# Patient Record
Sex: Male | Born: 1960 | Race: White | Hispanic: No | Marital: Single | Smoking: Former smoker
Health system: Southern US, Community
[De-identification: ages and names within clinical notes are randomized; demographics above are authoritative.]

## PROBLEM LIST (undated history)

## (undated) DIAGNOSIS — I10 Essential (primary) hypertension: Secondary | ICD-10-CM

## (undated) DIAGNOSIS — G2582 Stiff-man syndrome: Secondary | ICD-10-CM

## (undated) DIAGNOSIS — F410 Panic disorder [episodic paroxysmal anxiety] without agoraphobia: Secondary | ICD-10-CM

## (undated) DIAGNOSIS — S7290XA Unspecified fracture of unspecified femur, initial encounter for closed fracture: Secondary | ICD-10-CM

## (undated) DIAGNOSIS — K449 Diaphragmatic hernia without obstruction or gangrene: Secondary | ICD-10-CM

## (undated) DIAGNOSIS — F988 Other specified behavioral and emotional disorders with onset usually occurring in childhood and adolescence: Secondary | ICD-10-CM

## (undated) DIAGNOSIS — F411 Generalized anxiety disorder: Secondary | ICD-10-CM

## (undated) DIAGNOSIS — M545 Low back pain: Secondary | ICD-10-CM

## (undated) DIAGNOSIS — K219 Gastro-esophageal reflux disease without esophagitis: Secondary | ICD-10-CM

## (undated) DIAGNOSIS — S42009A Fracture of unspecified part of unspecified clavicle, initial encounter for closed fracture: Secondary | ICD-10-CM

## (undated) DIAGNOSIS — G8929 Other chronic pain: Secondary | ICD-10-CM

## (undated) HISTORY — PX: TONSILLECTOMY: SUR1361

## (undated) HISTORY — DX: Essential (primary) hypertension: I10

## (undated) HISTORY — PX: SPINE SURGERY: SHX786

## (undated) HISTORY — DX: Generalized anxiety disorder: F41.1

## (undated) HISTORY — DX: Gastro-esophageal reflux disease without esophagitis: K21.9

## (undated) HISTORY — DX: Fracture of unspecified part of unspecified clavicle, initial encounter for closed fracture: S42.009A

## (undated) HISTORY — PX: OTHER SURGICAL HISTORY: SHX169

## (undated) HISTORY — DX: Diaphragmatic hernia without obstruction or gangrene: K44.9

## (undated) HISTORY — DX: Other chronic pain: G89.29

## (undated) HISTORY — DX: Stiff-man syndrome: G25.82

## (undated) HISTORY — DX: Other specified behavioral and emotional disorders with onset usually occurring in childhood and adolescence: F98.8

## (undated) HISTORY — PX: LUNG LOBECTOMY: SHX167

## (undated) HISTORY — DX: Unspecified fracture of unspecified femur, initial encounter for closed fracture: S72.90XA

## (undated) HISTORY — DX: Panic disorder (episodic paroxysmal anxiety): F41.0

## (undated) HISTORY — DX: Low back pain: M54.5

## (undated) HISTORY — PX: OPEN ANTERIOR SHOULDER RECONSTRUCTION: SHX2100

---

## 2000-05-01 ENCOUNTER — Encounter: Payer: Self-pay | Admitting: General Surgery

## 2000-05-01 ENCOUNTER — Inpatient Hospital Stay (HOSPITAL_COMMUNITY): Admission: EM | Admit: 2000-05-01 | Discharge: 2000-05-03 | Payer: Self-pay

## 2000-05-01 ENCOUNTER — Encounter: Payer: Self-pay | Admitting: Emergency Medicine

## 2000-05-08 ENCOUNTER — Ambulatory Visit (HOSPITAL_COMMUNITY): Admission: RE | Admit: 2000-05-08 | Discharge: 2000-05-08 | Payer: Self-pay | Admitting: General Surgery

## 2000-05-08 ENCOUNTER — Encounter: Payer: Self-pay | Admitting: General Surgery

## 2000-05-08 ENCOUNTER — Ambulatory Visit (HOSPITAL_COMMUNITY): Admission: RE | Admit: 2000-05-08 | Discharge: 2000-05-08 | Payer: Self-pay

## 2000-05-14 ENCOUNTER — Ambulatory Visit (HOSPITAL_COMMUNITY): Admission: RE | Admit: 2000-05-14 | Discharge: 2000-05-14 | Payer: Self-pay | Admitting: Orthopedic Surgery

## 2000-05-14 ENCOUNTER — Encounter: Payer: Self-pay | Admitting: Orthopedic Surgery

## 2000-05-19 ENCOUNTER — Emergency Department (HOSPITAL_COMMUNITY): Admission: EM | Admit: 2000-05-19 | Discharge: 2000-05-19 | Payer: Self-pay | Admitting: Emergency Medicine

## 2000-05-29 ENCOUNTER — Ambulatory Visit (HOSPITAL_BASED_OUTPATIENT_CLINIC_OR_DEPARTMENT_OTHER): Admission: RE | Admit: 2000-05-29 | Discharge: 2000-05-29 | Payer: Self-pay | Admitting: Orthopedic Surgery

## 2000-09-28 ENCOUNTER — Encounter: Payer: Self-pay | Admitting: Emergency Medicine

## 2000-09-28 ENCOUNTER — Emergency Department (HOSPITAL_COMMUNITY): Admission: EM | Admit: 2000-09-28 | Discharge: 2000-09-28 | Payer: Self-pay | Admitting: Emergency Medicine

## 2000-10-08 ENCOUNTER — Emergency Department (HOSPITAL_COMMUNITY): Admission: EM | Admit: 2000-10-08 | Discharge: 2000-10-08 | Payer: Self-pay | Admitting: Emergency Medicine

## 2000-10-08 ENCOUNTER — Encounter: Payer: Self-pay | Admitting: Emergency Medicine

## 2000-10-18 ENCOUNTER — Emergency Department (HOSPITAL_COMMUNITY): Admission: EM | Admit: 2000-10-18 | Discharge: 2000-10-18 | Payer: Self-pay | Admitting: Emergency Medicine

## 2001-04-01 ENCOUNTER — Ambulatory Visit (HOSPITAL_BASED_OUTPATIENT_CLINIC_OR_DEPARTMENT_OTHER): Admission: RE | Admit: 2001-04-01 | Discharge: 2001-04-01 | Payer: Self-pay | Admitting: *Deleted

## 2002-04-13 ENCOUNTER — Emergency Department (HOSPITAL_COMMUNITY): Admission: EM | Admit: 2002-04-13 | Discharge: 2002-04-13 | Payer: Self-pay | Admitting: *Deleted

## 2002-04-15 ENCOUNTER — Emergency Department (HOSPITAL_COMMUNITY): Admission: EM | Admit: 2002-04-15 | Discharge: 2002-04-15 | Payer: Self-pay | Admitting: Emergency Medicine

## 2002-04-15 ENCOUNTER — Encounter: Payer: Self-pay | Admitting: Emergency Medicine

## 2002-04-22 ENCOUNTER — Emergency Department (HOSPITAL_COMMUNITY): Admission: EM | Admit: 2002-04-22 | Discharge: 2002-04-22 | Payer: Self-pay | Admitting: Emergency Medicine

## 2002-04-28 ENCOUNTER — Emergency Department (HOSPITAL_COMMUNITY): Admission: EM | Admit: 2002-04-28 | Discharge: 2002-04-28 | Payer: Self-pay | Admitting: Emergency Medicine

## 2002-05-04 ENCOUNTER — Encounter: Admission: RE | Admit: 2002-05-04 | Discharge: 2002-05-04 | Payer: Self-pay | Admitting: Internal Medicine

## 2002-05-04 ENCOUNTER — Encounter: Payer: Self-pay | Admitting: Internal Medicine

## 2002-06-05 ENCOUNTER — Encounter: Payer: Self-pay | Admitting: Neurosurgery

## 2002-06-06 ENCOUNTER — Emergency Department (HOSPITAL_COMMUNITY): Admission: EM | Admit: 2002-06-06 | Discharge: 2002-06-06 | Payer: Self-pay

## 2002-06-09 ENCOUNTER — Encounter: Payer: Self-pay | Admitting: Neurosurgery

## 2002-06-09 ENCOUNTER — Ambulatory Visit (HOSPITAL_COMMUNITY): Admission: RE | Admit: 2002-06-09 | Discharge: 2002-06-09 | Payer: Self-pay | Admitting: Neurosurgery

## 2002-07-08 ENCOUNTER — Ambulatory Visit (HOSPITAL_COMMUNITY): Admission: RE | Admit: 2002-07-08 | Discharge: 2002-07-08 | Payer: Self-pay | Admitting: Neurosurgery

## 2002-07-08 ENCOUNTER — Encounter: Payer: Self-pay | Admitting: Neurosurgery

## 2002-07-17 ENCOUNTER — Encounter: Admission: RE | Admit: 2002-07-17 | Discharge: 2002-08-28 | Payer: Self-pay | Admitting: Neurosurgery

## 2002-10-06 ENCOUNTER — Emergency Department (HOSPITAL_COMMUNITY): Admission: EM | Admit: 2002-10-06 | Discharge: 2002-10-06 | Payer: Self-pay | Admitting: Emergency Medicine

## 2004-06-30 ENCOUNTER — Emergency Department (HOSPITAL_COMMUNITY): Admission: EM | Admit: 2004-06-30 | Discharge: 2004-06-30 | Payer: Self-pay | Admitting: Emergency Medicine

## 2004-07-01 ENCOUNTER — Ambulatory Visit (HOSPITAL_COMMUNITY): Admission: RE | Admit: 2004-07-01 | Discharge: 2004-07-01 | Payer: Self-pay | Admitting: Sports Medicine

## 2004-07-16 ENCOUNTER — Emergency Department (HOSPITAL_COMMUNITY): Admission: EM | Admit: 2004-07-16 | Discharge: 2004-07-17 | Payer: Self-pay | Admitting: Emergency Medicine

## 2004-10-13 ENCOUNTER — Ambulatory Visit: Payer: Self-pay | Admitting: Internal Medicine

## 2004-12-06 ENCOUNTER — Encounter
Admission: RE | Admit: 2004-12-06 | Discharge: 2005-03-06 | Payer: Self-pay | Admitting: Physical Medicine & Rehabilitation

## 2005-05-23 ENCOUNTER — Emergency Department (HOSPITAL_COMMUNITY): Admission: EM | Admit: 2005-05-23 | Discharge: 2005-05-24 | Payer: Self-pay | Admitting: Emergency Medicine

## 2005-06-01 ENCOUNTER — Ambulatory Visit: Payer: Self-pay | Admitting: Internal Medicine

## 2006-02-13 ENCOUNTER — Observation Stay (HOSPITAL_COMMUNITY): Admission: AD | Admit: 2006-02-13 | Discharge: 2006-02-14 | Payer: Self-pay | Admitting: Internal Medicine

## 2006-02-13 ENCOUNTER — Ambulatory Visit: Payer: Self-pay | Admitting: Internal Medicine

## 2006-02-14 ENCOUNTER — Ambulatory Visit: Payer: Self-pay | Admitting: Internal Medicine

## 2006-02-20 ENCOUNTER — Ambulatory Visit: Payer: Self-pay | Admitting: Internal Medicine

## 2006-10-16 ENCOUNTER — Ambulatory Visit: Payer: Self-pay | Admitting: Internal Medicine

## 2007-01-28 DIAGNOSIS — K219 Gastro-esophageal reflux disease without esophagitis: Secondary | ICD-10-CM

## 2007-01-28 DIAGNOSIS — S42009A Fracture of unspecified part of unspecified clavicle, initial encounter for closed fracture: Secondary | ICD-10-CM

## 2007-01-28 DIAGNOSIS — M545 Low back pain, unspecified: Secondary | ICD-10-CM

## 2007-01-28 DIAGNOSIS — F411 Generalized anxiety disorder: Secondary | ICD-10-CM

## 2007-01-28 DIAGNOSIS — S7290XA Unspecified fracture of unspecified femur, initial encounter for closed fracture: Secondary | ICD-10-CM

## 2007-01-28 DIAGNOSIS — R32 Unspecified urinary incontinence: Secondary | ICD-10-CM

## 2007-01-28 DIAGNOSIS — K449 Diaphragmatic hernia without obstruction or gangrene: Secondary | ICD-10-CM

## 2007-01-28 DIAGNOSIS — F410 Panic disorder [episodic paroxysmal anxiety] without agoraphobia: Secondary | ICD-10-CM

## 2007-01-28 DIAGNOSIS — F988 Other specified behavioral and emotional disorders with onset usually occurring in childhood and adolescence: Secondary | ICD-10-CM

## 2007-01-28 HISTORY — DX: Fracture of unspecified part of unspecified clavicle, initial encounter for closed fracture: S42.009A

## 2007-01-28 HISTORY — DX: Diaphragmatic hernia without obstruction or gangrene: K44.9

## 2007-01-28 HISTORY — DX: Generalized anxiety disorder: F41.1

## 2007-01-28 HISTORY — DX: Panic disorder (episodic paroxysmal anxiety): F41.0

## 2007-01-28 HISTORY — DX: Gastro-esophageal reflux disease without esophagitis: K21.9

## 2007-01-28 HISTORY — DX: Other specified behavioral and emotional disorders with onset usually occurring in childhood and adolescence: F98.8

## 2007-01-28 HISTORY — DX: Low back pain, unspecified: M54.50

## 2007-01-28 HISTORY — DX: Unspecified fracture of unspecified femur, initial encounter for closed fracture: S72.90XA

## 2007-08-14 ENCOUNTER — Telehealth: Payer: Self-pay | Admitting: Internal Medicine

## 2007-12-13 ENCOUNTER — Ambulatory Visit: Payer: Self-pay | Admitting: Internal Medicine

## 2007-12-13 DIAGNOSIS — I10 Essential (primary) hypertension: Secondary | ICD-10-CM

## 2007-12-13 HISTORY — DX: Essential (primary) hypertension: I10

## 2008-01-10 ENCOUNTER — Telehealth: Payer: Self-pay | Admitting: Internal Medicine

## 2008-02-10 ENCOUNTER — Telehealth (INDEPENDENT_AMBULATORY_CARE_PROVIDER_SITE_OTHER): Payer: Self-pay | Admitting: *Deleted

## 2008-02-17 ENCOUNTER — Ambulatory Visit: Payer: Self-pay | Admitting: Internal Medicine

## 2008-03-10 ENCOUNTER — Encounter (INDEPENDENT_AMBULATORY_CARE_PROVIDER_SITE_OTHER): Payer: Self-pay | Admitting: *Deleted

## 2008-03-13 ENCOUNTER — Telehealth (INDEPENDENT_AMBULATORY_CARE_PROVIDER_SITE_OTHER): Payer: Self-pay | Admitting: *Deleted

## 2008-04-17 ENCOUNTER — Encounter
Admission: RE | Admit: 2008-04-17 | Discharge: 2008-04-17 | Payer: Self-pay | Admitting: Physical Medicine & Rehabilitation

## 2008-04-17 ENCOUNTER — Telehealth (INDEPENDENT_AMBULATORY_CARE_PROVIDER_SITE_OTHER): Payer: Self-pay | Admitting: *Deleted

## 2008-05-19 ENCOUNTER — Ambulatory Visit: Payer: Self-pay | Admitting: Internal Medicine

## 2008-05-19 DIAGNOSIS — J069 Acute upper respiratory infection, unspecified: Secondary | ICD-10-CM | POA: Insufficient documentation

## 2008-06-15 ENCOUNTER — Telehealth (INDEPENDENT_AMBULATORY_CARE_PROVIDER_SITE_OTHER): Payer: Self-pay | Admitting: *Deleted

## 2008-08-17 ENCOUNTER — Ambulatory Visit: Payer: Self-pay | Admitting: Internal Medicine

## 2008-09-03 ENCOUNTER — Encounter: Payer: Self-pay | Admitting: Internal Medicine

## 2008-09-08 ENCOUNTER — Telehealth (INDEPENDENT_AMBULATORY_CARE_PROVIDER_SITE_OTHER): Payer: Self-pay | Admitting: *Deleted

## 2008-11-10 ENCOUNTER — Ambulatory Visit: Payer: Self-pay | Admitting: Internal Medicine

## 2009-01-15 ENCOUNTER — Telehealth (INDEPENDENT_AMBULATORY_CARE_PROVIDER_SITE_OTHER): Payer: Self-pay | Admitting: *Deleted

## 2009-02-08 ENCOUNTER — Telehealth: Payer: Self-pay | Admitting: Internal Medicine

## 2009-05-17 ENCOUNTER — Ambulatory Visit: Payer: Self-pay | Admitting: Internal Medicine

## 2009-05-17 DIAGNOSIS — M25519 Pain in unspecified shoulder: Secondary | ICD-10-CM

## 2009-08-09 ENCOUNTER — Telehealth: Payer: Self-pay | Admitting: Internal Medicine

## 2009-11-11 ENCOUNTER — Ambulatory Visit: Payer: Self-pay | Admitting: Internal Medicine

## 2010-02-07 ENCOUNTER — Telehealth: Payer: Self-pay | Admitting: Internal Medicine

## 2010-05-10 ENCOUNTER — Ambulatory Visit: Payer: Self-pay | Admitting: Internal Medicine

## 2010-05-10 DIAGNOSIS — R5383 Other fatigue: Secondary | ICD-10-CM

## 2010-05-10 DIAGNOSIS — R5381 Other malaise: Secondary | ICD-10-CM | POA: Insufficient documentation
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
## 2010-07-26 NOTE — Progress Notes (Signed)
Summary: Rx refill  Phone Note Call from Patient Call back at Home Phone [phone]   Caller: Patient Summary of Call: Pt called requesting refills of Adderall, Diazepam amd Morphine sulfate. Initial call taken by: Margaret Pyle, CMA,  February 07, 2010 10:12 AM  Follow-up for Phone Call        all done hardcopy to LIM side B - dahlia  Follow-up by: Corwin Levins MD,  February 07, 2010 1:49 PM  Additional Follow-up for Phone Call Additional follow up Details #1::        Pt informed, Rx in cabinet for pt pick up Additional Follow-up by: Margaret Pyle, CMA,  February 07, 2010 1:56 PM    New/Updated Medications: DIAZEPAM 5 MG TABS (DIAZEPAM) 1po three times a day as needed ADDERALL 20 MG  TABS (AMPHETAMINE-DEXTROAMPHETAMINE) 1 by mouth  two times a day - to fill sept 17, 2011 ADDERALL 20 MG  TABS (AMPHETAMINE-DEXTROAMPHETAMINE) 1 by mouth  two times a day - to fill Apr 11, 2010 ADDERALL 20 MG  TABS (AMPHETAMINE-DEXTROAMPHETAMINE) 1 by mouth  two times a day - to fill Feb 10, 2010 MS CONTIN 30 MG XR12H-TAB (MORPHINE SULFATE) 1 by mouth two times a day - to fill Feb 10, 2010 MS CONTIN 30 MG XR12H-TAB (MORPHINE SULFATE) 1 by mouth two times a day - to fill sept 17, 2011 MS CONTIN 30 MG XR12H-TAB (MORPHINE SULFATE) 1 by mouth two times a day - to fill Apr 11, 2010 Prescriptions: MS CONTIN 30 MG XR12H-TAB (MORPHINE SULFATE) 1 by mouth two times a day - to fill Apr 11, 2010  #60 x 0   Entered and Authorized by:   Corwin Levins MD   Signed by:   Corwin Levins MD on 02/07/2010   Method used:   Print then Give to Patient   RxID:   [id number] MS CONTIN 30 MG XR12H-TAB (MORPHINE SULFATE) 1 by mouth two times a day - to fill sept 17, 2011  #60 x 0   Entered and Authorized by:   Corwin Levins MD   Signed by:   Corwin Levins MD on 02/07/2010   Method used:   Print then Give to Patient   RxID:   [id number] MS CONTIN 30 MG XR12H-TAB (MORPHINE SULFATE) 1 by mouth two times a  day - to fill Feb 10, 2010  #60 x 0   Entered and Authorized by:   Corwin Levins MD   Signed by:   Corwin Levins MD on 02/07/2010   Method used:   Print then Give to Patient   RxID:   [id number] DIAZEPAM 5 MG TABS (DIAZEPAM) 1po three times a day as needed  #90 x 2   Entered and Authorized by:   Corwin Levins MD   Signed by:   Corwin Levins MD on 02/07/2010   Method used:   Print then Give to Patient   RxID:   [id number] ADDERALL 20 MG  TABS (AMPHETAMINE-DEXTROAMPHETAMINE) 1 by mouth  two times a day - to fill Apr 11, 2010  #60 x 0   Entered and Authorized by:   Corwin Levins MD   Signed by:   Corwin Levins MD on 02/07/2010   Method used:   Print then Give to Patient   RxID:   [id number] ADDERALL 20 MG  TABS (AMPHETAMINE-DEXTROAMPHETAMINE) 1 by mouth  two times a day - to fill sept 17, 2011  #60 x 0  Entered and Authorized by:   Corwin Levins MD   Signed by:   Corwin Levins MD on 02/07/2010   Method used:   Print then Give to Patient   RxID:   [id number] ADDERALL 20 MG  TABS (AMPHETAMINE-DEXTROAMPHETAMINE) 1 by mouth  two times a day - to fill Feb 10, 2010  #60 x 0   Entered and Authorized by:   Corwin Levins MD   Signed by:   Corwin Levins MD on 02/07/2010   Method used:   Print then Give to Patient   RxID:   [id number]

## 2010-07-26 NOTE — Progress Notes (Signed)
Summary: refills  Phone Note Call from Patient Call back at Home Phone [phone]   Caller: Patient Summary of Call: pt called requesting 3 month refills of Morphine and Adderall. Pt will be in [facility] Thursday afternoon. Initial call taken by: Margaret Pyle, CMA,  August 09, 2009 1:51 PM  Follow-up for Phone Call        all done hardcopy to LIM side B - dahlia  Follow-up by: Corwin Levins MD,  August 09, 2009 4:53 PM  Additional Follow-up for Phone Call Additional follow up Details #1::        pt informed, rx in cabinet for pt pick up Additional Follow-up by: Margaret Pyle, CMA,  August 10, 2009 8:17 AM    New/Updated Medications: ADDERALL 20 MG  TABS (AMPHETAMINE-DEXTROAMPHETAMINE) 1 by mouth  two times a day - to fill Sep 13, 2009 ADDERALL 20 MG  TABS (AMPHETAMINE-DEXTROAMPHETAMINE) 1 by mouth  two times a day - to fill Oct 13, 2009 ADDERALL 20 MG  TABS (AMPHETAMINE-DEXTROAMPHETAMINE) 1 by mouth  two times a day - to fill Aug 14, 2009 MS CONTIN 30 MG XR12H-TAB (MORPHINE SULFATE) 1 by mouth two times a day - to fill Oct 13, 2009 MS CONTIN 30 MG XR12H-TAB (MORPHINE SULFATE) 1 by mouth two times a day - to fill Aug 14, 2009 MS CONTIN 30 MG XR12H-TAB (MORPHINE SULFATE) 1 by mouth two times a day - to fill Sep 13, 2009 Prescriptions: ADDERALL 20 MG  TABS (AMPHETAMINE-DEXTROAMPHETAMINE) 1 by mouth  two times a day - to fill Oct 13, 2009  #60 x 0   Entered and Authorized by:   Corwin Levins MD   Signed by:   Corwin Levins MD on 08/09/2009   Method used:   Print then Give to Patient   RxID:   [id number] ADDERALL 20 MG  TABS (AMPHETAMINE-DEXTROAMPHETAMINE) 1 by mouth  two times a day - to fill Sep 13, 2009  #60 x 0   Entered and Authorized by:   Corwin Levins MD   Signed by:   Corwin Levins MD on 08/09/2009   Method used:   Print then Give to Patient   RxID:   [id number] ADDERALL 20 MG  TABS (AMPHETAMINE-DEXTROAMPHETAMINE) 1 by mouth  two times a  day - to fill Aug 14, 2009  #60 x 0   Entered and Authorized by:   Corwin Levins MD   Signed by:   Corwin Levins MD on 08/09/2009   Method used:   Print then Give to Patient   RxID:   [id number] MS CONTIN 30 MG XR12H-TAB (MORPHINE SULFATE) 1 by mouth two times a day - to fill Oct 13, 2009  #60 x 0   Entered and Authorized by:   Corwin Levins MD   Signed by:   Corwin Levins MD on 08/09/2009   Method used:   Print then Give to Patient   RxID:   [id number] MS CONTIN 30 MG XR12H-TAB (MORPHINE SULFATE) 1 by mouth two times a day - to fill Sep 13, 2009  #60 x 0   Entered and Authorized by:   Corwin Levins MD   Signed by:   Corwin Levins MD on 08/09/2009   Method used:   Print then Give to Patient   RxID:   [id number] MS CONTIN 30 MG XR12H-TAB (MORPHINE SULFATE) 1 by mouth two times a day - to fill Aug 14, 2009  #60 x 0  Entered and Authorized by:   Corwin Levins MD   Signed by:   Corwin Levins MD on 08/09/2009   Method used:   Print then Give to Patient   RxID:   [id number]
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
## 2010-08-04 ENCOUNTER — Telehealth: Payer: Self-pay | Admitting: Internal Medicine

## 2010-08-11 NOTE — Progress Notes (Signed)
Summary: Rx refill req  Phone Note Refill Request Message from:  Patient on August 04, 2010 10:27 AM  Refills Requested: Medication #1:  MS CONTIN 30 MG XR12H-TAB 1 by mouth two times a day - to fill jan 15   Dosage confirmed as above?Dosage Confirmed   Supply Requested: 1 month  Method Requested: Pick up at Office Initial call taken by: Margaret Pyle, CMA,  August 04, 2010 10:27 AM  Follow-up for Phone Call        done hardcopy to LIM side B - dahlia  Follow-up by: Corwin Levins MD,  August 04, 2010 11:01 AM  Additional Follow-up for Phone Call Additional follow up Details #1::        Notified pt rx's ready for pick-up. Pt also want to know can he get refill on his adderal. Pls advise? Additional Follow-up by: Orlan Leavens RMA,  August 04, 2010 11:33 AM    Additional Follow-up for Phone Call Additional follow up Details #2::    adderall - done hardcopy to LIM side B - dahlia  Follow-up by: Corwin Levins MD,  August 04, 2010 11:46 AM  Additional Follow-up for Phone Call Additional follow up Details #3:: Details for Additional Follow-up Action Taken: MD sign adderal rx's will put in envelope with MS Rx's. Pt was notified Additional Follow-up by: Orlan Leavens RMA,  August 04, 2010 11:52 AM  New/Updated Medications: ADDERALL 20 MG  TABS (AMPHETAMINE-DEXTROAMPHETAMINE) 1 by mouth  two times a day - to fill Sep 04, 2010 ADDERALL 20 MG  TABS (AMPHETAMINE-DEXTROAMPHETAMINE) 1 by mouth  two times a day - to fill Oct 04, 2010 ADDERALL 20 MG  TABS (AMPHETAMINE-DEXTROAMPHETAMINE) 1 by mouth  two times a day - to fill Aug 04, 2010 MS CONTIN 30 MG XR12H-TAB (MORPHINE SULFATE) 1 by mouth two times a day - to fill Oct 09, 2010 - please make return visit for further refills MS CONTIN 30 MG XR12H-TAB (MORPHINE SULFATE) 1 by mouth two times a day - to fill Aug 09, 2010 MS CONTIN 30 MG XR12H-TAB (MORPHINE SULFATE) 1 by mouth two times a day - to fill Sep 09, 2010 Prescriptions: ADDERALL 20 MG  TABS (AMPHETAMINE-DEXTROAMPHETAMINE) 1 by mouth  two times a day - to fill Oct 04, 2010  #60 x 0   Entered and Authorized by:   Corwin Levins MD   Signed by:   Corwin Levins MD on 08/04/2010   Method used:   Print then Give to Patient   RxID:   [id number] ADDERALL 20 MG  TABS (AMPHETAMINE-DEXTROAMPHETAMINE) 1 by mouth  two times a day - to fill Sep 04, 2010  #60 x 0   Entered and Authorized by:   Corwin Levins MD   Signed by:   Corwin Levins MD on 08/04/2010   Method used:   Print then Give to Patient   RxID:   [id number] ADDERALL 20 MG  TABS (AMPHETAMINE-DEXTROAMPHETAMINE) 1 by mouth  two times a day - to fill Aug 04, 2010  #60 x 0   Entered and Authorized by:   Corwin Levins MD   Signed by:   Corwin Levins MD on 08/04/2010   Method used:   Print then Give to Patient   RxID:   [id number] MS CONTIN 30 MG XR12H-TAB (MORPHINE SULFATE) 1 by mouth two times a day - to fill Oct 09, 2010 - please make return visit for  further refills  #60 x 0   Entered and Authorized by:   Corwin Levins MD   Signed by:   Corwin Levins MD on 08/04/2010   Method used:   Print then Give to Patient   RxID:   [id number] MS CONTIN 30 MG XR12H-TAB (MORPHINE SULFATE) 1 by mouth two times a day - to fill Sep 09, 2010  #60 x 0   Entered and Authorized by:   Corwin Levins MD   Signed by:   Corwin Levins MD on 08/04/2010   Method used:   Print then Give to Patient   RxID:   [id number] MS CONTIN 30 MG XR12H-TAB (MORPHINE SULFATE) 1 by mouth two times a day - to fill Aug 09, 2010  #60 x 0   Entered and Authorized by:   Corwin Levins MD   Signed by:   Corwin Levins MD on 08/04/2010   Method used:   Print then Give to Patient   RxID:   [id number]

## 2010-11-06 ENCOUNTER — Encounter: Payer: Self-pay | Admitting: Internal Medicine

## 2010-11-06 DIAGNOSIS — Z0000 Encounter for general adult medical examination without abnormal findings: Secondary | ICD-10-CM | POA: Insufficient documentation

## 2010-11-07 ENCOUNTER — Encounter: Payer: Self-pay | Admitting: Internal Medicine

## 2010-11-08 ENCOUNTER — Ambulatory Visit (INDEPENDENT_AMBULATORY_CARE_PROVIDER_SITE_OTHER): Payer: Self-pay | Admitting: Internal Medicine

## 2010-11-08 ENCOUNTER — Encounter: Payer: Self-pay | Admitting: Internal Medicine

## 2010-11-08 VITALS — BP 104/64 | HR 105 | Temp 98.6°F | Ht 72.0 in | Wt 199.2 lb

## 2010-11-08 DIAGNOSIS — M545 Low back pain, unspecified: Secondary | ICD-10-CM

## 2010-11-08 DIAGNOSIS — G8929 Other chronic pain: Secondary | ICD-10-CM

## 2010-11-08 DIAGNOSIS — F411 Generalized anxiety disorder: Secondary | ICD-10-CM

## 2010-11-08 DIAGNOSIS — F988 Other specified behavioral and emotional disorders with onset usually occurring in childhood and adolescence: Secondary | ICD-10-CM

## 2010-11-08 DIAGNOSIS — I10 Essential (primary) hypertension: Secondary | ICD-10-CM

## 2010-11-08 DIAGNOSIS — N320 Bladder-neck obstruction: Secondary | ICD-10-CM

## 2010-11-08 DIAGNOSIS — G2582 Stiff-man syndrome: Secondary | ICD-10-CM

## 2010-11-08 HISTORY — DX: Stiff-man syndrome: G25.82

## 2010-11-08 HISTORY — DX: Other chronic pain: G89.29

## 2010-11-08 HISTORY — DX: Low back pain, unspecified: M54.50

## 2010-11-08 MED ORDER — AMPHETAMINE-DEXTROAMPHETAMINE 20 MG PO TABS: 20.0000 mg | ORAL_TABLET | Freq: Two times a day (BID) | ORAL | Status: DC

## 2010-11-08 MED ORDER — MORPHINE SULFATE CR 30 MG PO TB12: 30.0000 mg | ORAL_TABLET | Freq: Two times a day (BID) | ORAL | Status: DC

## 2010-11-08 MED ORDER — DIAZEPAM 5 MG PO TABS: 5.0000 mg | ORAL_TABLET | Freq: Three times a day (TID) | ORAL | Status: DC | PRN

## 2010-11-08 MED ORDER — BENAZEPRIL HCL 5 MG PO TABS: 5.0000 mg | ORAL_TABLET | Freq: Every day | ORAL | Status: DC
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
## 2010-11-08 NOTE — Assessment & Plan Note (Signed)
stable overall by hx and exam, and pt to continue medical treatment as before 

## 2010-11-08 NOTE — Patient Instructions (Signed)
Continue all other medications as before Please go to LAB in the Basement for the blood and/or urine tests to be done today Please call the phone number [phone] (the PhoneTree System) for results of testing in 2-3 days;  When calling, simply dial the number, and when prompted enter the MRN number above (the Medical Record Number) and the # key, then the message should start. Please return in 6 months 

## 2010-11-08 NOTE — Assessment & Plan Note (Signed)
stable overall by hx and exam, most recent lab reviewed with pt, and pt to continue medical treatment as before  BP Readings from Last 3 Encounters:  11/08/10 104/64  05/10/10 110/70  11/11/09 110/72   For labs today

## 2010-11-08 NOTE — Assessment & Plan Note (Signed)
stable overall by hx and exam, most recent lab reviewed with pt, and pt to continue medical treatment as before 

## 2010-11-08 NOTE — Assessment & Plan Note (Signed)
To check psa today
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
## 2010-11-11 NOTE — Op Note (Signed)
NAME:  Peter Holloway, Peter Holloway                         ACCOUNT NO.:  [id number]   MEDICAL RECORD NO.:  [id number]                   PATIENT TYPE:  EMS   LOCATION:  [facility]                                 FACILITY:  [facility]   PHYSICIAN:  Kathaleen Maser. Pool, M.D.                 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   OPERATION PERFORMED:  Right L2-3 laminotomy with microdiskectomy.   SURGEON:  Kathaleen Maser. Pool, M.D.   ASSISTANT:  Reinaldo Meeker, M.D.   ANESTHESIA:  General endotracheal.   INDICATIONS FOR PROCEDURE:  The patient is a 50 year old male with a history  of right lower extremity pain, paresthesias, weakness consistent with a  right-sided L2 and L3 radiculopathy with has failed conservative management.  It should be noted that on the patient's chart, he was improperly consented  for a left-sided laminotomy and microdiskectomy.  With discussion with the  patient, once again reviewing his films and his examination, it was  determined that in fact, the problem was right-sided and the consent form  was changed to read right L2-3 laminotomy and microdiskectomy.  The patient  agreed to proceed with surgery.   DESCRIPTION OF PROCEDURE:  The patient was taken to the operating room and  placed on the table in the supine position.  After adequate level of  anesthesia was achieved, the patient was positioned prone.  The patient's  lumbar region was prepped and draped sterilely.  A 10 blade was used to make  a linear skin incision overlying the L2-3 interspace.  This was carried down  sharply in the midline.  A subperiosteal dissection was then performed  exposing the lamina and facet joints of L2 and L3 on the right.  Deep  self-  retaining retractor was placed.  Intraoperative x-ray was taken, the level  was confirmed.  The laminotomy was then performed using high speed drill and  Kerrison rongeurs to remove the inferior one half of the lamina at L2, the  medial one quarter of the L2-3 facet joint and the superior rim of the  L3.  The ligamentum flavum was then elevated and resected in piecemeal fashion  using Kerrison rongeurs.  The underlying thecal sac and exiting L3 nerve  root were identified.  The microscope was brought into the field and used  for microdissection of the right-sided L3 nerve root and underlying disk  herniation.  The epidural venous plexus was coagulated and cut.  The thecal  sac and right-sided L3 nerve root were mobilized and retracted toward the  midline.  The epidural venous plexus was coagulated and cut.  Disk  herniation was apparent.  This  was then incised with a 15 blade in  rectangular fashion.  A wide disk space cleanout was then achieved pituitary  rongeurs and upward angled pituitary rongeurs and Epstein curets.  All loose  or obvious degenerative disk material was removed from the interspace.  All  elements of the disk herniation which was superolateral was completely  resected.  At this point a blunt probe was passed and easily opened the  thecal sac and nerve roots.  There was no evidence of injury to thecal sac  or nerve roots.  There was no evidence of residual compression.  The wound  was then irrigated with antibiotic solution.  Gelfoam was placed topically  for hemostasis which was found to be good.  Microscope and retractor system  were removed.  Hemostasis in the muscle was achieved with electrocautery.  The wound was then closed in layers with Vicryl sutures.  Steri-Strips and  sterile  dressing were applied.  There were no apparent complications.  The patient  tolerated the procedure well and returned to the recovery room  postoperatively.                                                 Henry A. Pool, M.D.    HAP/MEDQ  D:  06/09/2002  T:  06/09/2002  Job:  [id number]
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
## 2010-11-11 NOTE — Op Note (Signed)
[place]. [facility]  Patient:    Peter Holloway, Peter Holloway                        MRN: [id number] Proc. Date: 05/01/00 Adm. Date:  [id number] Attending:  Trauma, Md                           Operative Report  PREOPERATIVE DIAGNOSIS:  Right femoral shaft fracture.  POSTOPERATIVE DIAGNOSIS:  Right femoral shaft fracture.  PROCEDURE:  Closed reduction and retrograde intramedullary rodding, right femoral shaft using A/Depuy 10 mm x 25 cm femoral rod.  SURGEON:  Janet Berlin. Dan Humphreys, M.D.  ANESTHESIA:  General.  OPERATIVE TIME: One hour and 15 minutes.  COMPLICATIONS:  None.  DESCRIPTION OF PROCEDURE:  Ms. Quast was brought to the operating room on May 01, 2000, and placed under general anesthesia on the stretcher, and then transferred to the operative table.  His right leg was prepped using sterile Betadine and draped using a sterile technique.  He had good vascularity and pulses preoperatively.  Initially through a 3.0 cm patellar tendon medial incision, initial exposure was made.  The underlying subcutaneous tissues were incised in line with the skin incision.  A median parapatellar incision was made for entrance for a guide pin, into the distal intercondylar region of the femur under fluoroscopic control, and the guide pin was placed and drilled up proximally to the level of the fracture site. It was then overdrilled to a diameter of 12.5 mm with a step drill.  After this was done, then a guide wire was placed from the distal femoral shaft across the fracture site under fluoroscopic control, and into the proximal femoral shaft.  Sequential reaming was then carried out to an 11.5 mm size. Then a 10.0 mm x 25.0 cm femoral rod was placed retrograde from the distal femur, carefully across the fracture site and into the proximal femoral shaft, with the fracture being held in a reduced an anatomical position.  At this point, using the lateral guide, the two most  distal screw holes were drilled and measured and then sequentially a 90 mm and a 75 mm, 6.5 mm screw was placed through a 3.0 cm lateral distal incision.  Two two most proximal screw holes in the femoral rod were then also drilled under fluoroscopic control through another 3.0 cm lateral incision, and then two separate 5.5 mm screws were placed through the interlocking portion of the proximal femoral rod, holding the rod in an excellent position, and holding the fracture in a reduced and anatomical position.  After this was done, it was felt that all pathology had been satisfactorily addressed.  All wound were irrigated.  The arthrotomy was closed with #0 Vicryl suture.  The patellar tendon defect was closed with #0 Vicryl suture.  The iliotibial band and the two incisions laterally were closed with #0 Vicryl.  The subcutaneous tissues were closed with #2-0 Vicryl.  The skin was closed with skin staples.  Sterile dressings were applied, and the patient then was awakened and extubated and taken to the recovery room in stable condition.  Postoperatively the leg lengths were equal.  Rotation was equal.  The neurovascular status was normal.  The pulses were 2+ and symmetric.  The needle and sponge counts were correct x 2 at the end of the case. DD:  05/01/00 TD:  05/02/00 Job: [id number]
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
## 2011-01-20 ENCOUNTER — Other Ambulatory Visit: Payer: Self-pay | Admitting: Internal Medicine

## 2011-01-21 ENCOUNTER — Other Ambulatory Visit: Payer: Self-pay | Admitting: Internal Medicine

## 2011-02-02 ENCOUNTER — Other Ambulatory Visit: Payer: Self-pay

## 2011-02-02 MED ORDER — AMPHETAMINE-DEXTROAMPHETAMINE 20 MG PO TABS: 20.0000 mg | ORAL_TABLET | Freq: Two times a day (BID) | ORAL | Status: DC

## 2011-02-02 MED ORDER — MORPHINE SULFATE CR 30 MG PO TB12: 30.0000 mg | ORAL_TABLET | Freq: Two times a day (BID) | ORAL | Status: DC

## 2011-02-02 NOTE — Telephone Encounter (Signed)
Pt informed, Rx in cabinet for pt pick up  

## 2011-03-01 ENCOUNTER — Other Ambulatory Visit: Payer: Self-pay

## 2011-03-01 MED ORDER — AMPHETAMINE-DEXTROAMPHETAMINE 20 MG PO TABS: 20.0000 mg | ORAL_TABLET | Freq: Two times a day (BID) | ORAL | Status: DC

## 2011-03-01 MED ORDER — MORPHINE SULFATE CR 30 MG PO TB12: 30.0000 mg | ORAL_TABLET | Freq: Two times a day (BID) | ORAL | Status: DC

## 2011-03-01 NOTE — Telephone Encounter (Signed)
Called patient informed prescriptions are ready for pickup.

## 2011-03-01 NOTE — Telephone Encounter (Signed)
Pt called requesting refill, going out of town Friday.

## 2011-03-02 ENCOUNTER — Telehealth: Payer: Self-pay

## 2011-03-02 MED ORDER — AMPHETAMINE-DEXTROAMPHETAMINE 20 MG PO TABS: 20.0000 mg | ORAL_TABLET | Freq: Two times a day (BID) | ORAL | Status: DC

## 2011-03-02 MED ORDER — MORPHINE SULFATE CR 30 MG PO TB12: 30.0000 mg | ORAL_TABLET | Freq: Two times a day (BID) | ORAL | Status: DC

## 2011-03-02 NOTE — Telephone Encounter (Signed)
Pt called stating he has been picking up his narcotic medications 3 months at a time on saturdays for a while, Lately he stays that he has only been getting 1 month Rxs (he has made a specific 3 mth refill request once that I am aware of) and has not been allowed to pick up Rx of Sat. I personally advised pt several times that I would find out who is working sat clinic and if they would be able to give him his Rx. I have also been told that we should not advise pt that Rxs are available for pick any other time but M-F 8-5p. I told pt that I would find out if he can pick up Rx this coming Saturday but he is still not satisfied and requested I advise him of any other practices in his area ([place]). He says he made 4 trips to [facility] yearly, one for OV and the other 3 to pick up 3 months of Rxs. Should pt received 3 months of Rx filled yesterday?

## 2011-03-02 NOTE — Telephone Encounter (Signed)
Pt informed

## 2011-03-02 NOTE — Telephone Encounter (Signed)
I have not been doing 3  Mo rx for most pt 's since onset of epic as I have no way currently of tracking or knowing which pt's I have been doing that for (those who I feel would not abuse or lose the rx)  Ok for 2 more rx at this time - Done hardcopy to [facility]  Please inform pt to make sure he mentions 3 mo rx refills when requesting further  I would defer to Vicie Mutters and established office policy regarding when to pick up the Rx's

## 2011-05-07 ENCOUNTER — Other Ambulatory Visit: Payer: Self-pay | Admitting: Internal Medicine

## 2011-05-08 NOTE — Telephone Encounter (Signed)
Refill not due until nov 21  Ok to refill but will date accordingly

## 2011-05-08 NOTE — Telephone Encounter (Signed)
Faxed hardcopy to pharmacy. 

## 2011-05-15 ENCOUNTER — Ambulatory Visit: Payer: Self-pay | Admitting: Internal Medicine

## 2011-05-15 DIAGNOSIS — Z0289 Encounter for other administrative examinations: Secondary | ICD-10-CM

## 2011-05-26 ENCOUNTER — Encounter: Payer: Self-pay | Admitting: Internal Medicine

## 2011-05-26 ENCOUNTER — Ambulatory Visit (INDEPENDENT_AMBULATORY_CARE_PROVIDER_SITE_OTHER): Payer: Self-pay | Admitting: Internal Medicine

## 2011-05-26 VITALS — BP 110/82 | HR 67 | Temp 97.9°F | Ht 72.0 in | Wt 194.0 lb

## 2011-05-26 DIAGNOSIS — M545 Low back pain: Secondary | ICD-10-CM

## 2011-05-26 DIAGNOSIS — F988 Other specified behavioral and emotional disorders with onset usually occurring in childhood and adolescence: Secondary | ICD-10-CM

## 2011-05-26 DIAGNOSIS — Z23 Encounter for immunization: Secondary | ICD-10-CM

## 2011-05-26 DIAGNOSIS — G8929 Other chronic pain: Secondary | ICD-10-CM

## 2011-05-26 DIAGNOSIS — I10 Essential (primary) hypertension: Secondary | ICD-10-CM

## 2011-05-26 MED ORDER — MORPHINE SULFATE CR 30 MG PO TB12: 30.0000 mg | ORAL_TABLET | Freq: Two times a day (BID) | ORAL | Status: DC

## 2011-05-26 MED ORDER — AMPHETAMINE-DEXTROAMPHETAMINE 20 MG PO TABS: 20.0000 mg | ORAL_TABLET | Freq: Two times a day (BID) | ORAL | Status: DC

## 2011-05-26 NOTE — Patient Instructions (Signed)
You had the flu shot today Continue all other medications as before You are given the refills, please call for further refills Please return in 6 months, or sooner if needed

## 2011-05-27 ENCOUNTER — Encounter: Payer: Self-pay | Admitting: Internal Medicine
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
## 2011-05-27 NOTE — Assessment & Plan Note (Signed)
stable overall by hx and exam, , and pt to continue medical treatment as before, for med refill today

## 2011-05-27 NOTE — Assessment & Plan Note (Signed)
stable overall by hx and exam, most recent data reviewed with pt, and pt to continue medical treatment as before  BP Readings from Last 3 Encounters:  05/26/11 110/82  11/08/10 104/64  05/10/10 110/70

## 2011-05-27 NOTE — Assessment & Plan Note (Signed)
stable overall by hx and exam, and pt to continue medical treatment as before, for med refills today 

## 2011-08-28 ENCOUNTER — Other Ambulatory Visit: Payer: Self-pay

## 2011-08-28 MED ORDER — MORPHINE SULFATE CR 30 MG PO TB12: 30.0000 mg | ORAL_TABLET | Freq: Two times a day (BID) | ORAL | Status: DC

## 2011-08-28 NOTE — Telephone Encounter (Signed)
Patient informed he will pickup prescription at his convenience.

## 2011-08-28 NOTE — Telephone Encounter (Signed)
Pt called requesting 3 mth refill of pain medications.  

## 2011-08-28 NOTE — Telephone Encounter (Signed)
Done hardcopy to robin  

## 2011-11-10 ENCOUNTER — Other Ambulatory Visit: Payer: Self-pay | Admitting: Internal Medicine

## 2011-11-13 ENCOUNTER — Other Ambulatory Visit: Payer: Self-pay | Admitting: Internal Medicine

## 2011-11-24 ENCOUNTER — Ambulatory Visit: Payer: Self-pay | Admitting: Internal Medicine

## 2011-12-01 ENCOUNTER — Encounter: Payer: Self-pay | Admitting: Internal Medicine

## 2011-12-01 ENCOUNTER — Ambulatory Visit (INDEPENDENT_AMBULATORY_CARE_PROVIDER_SITE_OTHER): Payer: Self-pay | Admitting: Internal Medicine

## 2011-12-01 VITALS — BP 118/70 | HR 78 | Temp 98.6°F | Ht 71.5 in | Wt 191.1 lb

## 2011-12-01 DIAGNOSIS — Z7901 Long term (current) use of anticoagulants: Secondary | ICD-10-CM | POA: Insufficient documentation

## 2011-12-01 DIAGNOSIS — F411 Generalized anxiety disorder: Secondary | ICD-10-CM

## 2011-12-01 DIAGNOSIS — M545 Low back pain: Secondary | ICD-10-CM

## 2011-12-01 DIAGNOSIS — G8929 Other chronic pain: Secondary | ICD-10-CM

## 2011-12-01 DIAGNOSIS — F988 Other specified behavioral and emotional disorders with onset usually occurring in childhood and adolescence: Secondary | ICD-10-CM

## 2011-12-01 DIAGNOSIS — I10 Essential (primary) hypertension: Secondary | ICD-10-CM

## 2011-12-01 MED ORDER — BENAZEPRIL HCL 5 MG PO TABS: 5.0000 mg | ORAL_TABLET | Freq: Every day | ORAL | Status: DC

## 2011-12-01 MED ORDER — MORPHINE SULFATE ER 30 MG PO TBCR: 30.0000 mg | EXTENDED_RELEASE_TABLET | Freq: Two times a day (BID) | ORAL | Status: DC

## 2011-12-01 MED ORDER — WARFARIN SODIUM 5 MG PO TABS: 5.0000 mg | ORAL_TABLET | Freq: Every day | ORAL | Status: DC

## 2011-12-01 MED ORDER — DIAZEPAM 2 MG PO TABS: 2.0000 mg | ORAL_TABLET | Freq: Two times a day (BID) | ORAL | Status: AC | PRN

## 2011-12-01 MED ORDER — NAPROXEN 500 MG PO TABS: 500.0000 mg | ORAL_TABLET | Freq: Two times a day (BID) | ORAL | Status: DC

## 2011-12-01 NOTE — Patient Instructions (Addendum)
Please disregard the blood thinner coumadin prescription accidentally sent to your pharmacy OK to detail the  Valium to 2 mg three times per day for 2 wks, then 1/2 tab three times per day until gone, then stop completely after that OK to stop the adderall You are given the [facility] contin refills  Please stop the [facility] the naproxen as needed for pain as well, but it may benefit you to take Prilosec 20 mg per day with it to avoid stomach irritation Please return in 6 months, or sooner if needed

## 2011-12-02 ENCOUNTER — Encounter: Payer: Self-pay | Admitting: Internal Medicine
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
## 2011-12-02 NOTE — Assessment & Plan Note (Signed)
stable overall by hx and exam, most recent data reviewed with pt, and pt to continue medical treatment as before BP Readings from Last 3 Encounters:  12/01/11 118/70  05/26/11 110/82  11/08/10 104/64

## 2011-12-02 NOTE — Assessment & Plan Note (Signed)
Ok to wean off valium asd ,  to f/u any worsening symptoms or concerns

## 2011-12-02 NOTE — Assessment & Plan Note (Signed)
stable overall by hx and exam, mt, and pt ok to come off med for now

## 2011-12-02 NOTE — Assessment & Plan Note (Signed)
stable overall by hx and exam, and pt to continue medical treatment as before 

## 2012-01-15 ENCOUNTER — Other Ambulatory Visit: Payer: Self-pay

## 2012-01-15 MED ORDER — DIAZEPAM 2 MG PO TABS
2.0000 mg | ORAL_TABLET | Freq: Two times a day (BID) | ORAL | Status: AC | PRN
Start: 2012-01-15 — End: 2012-01-25

## 2012-01-15 NOTE — Telephone Encounter (Signed)
Faxed hardcopy to pharmacy. 

## 2012-01-15 NOTE — Telephone Encounter (Signed)
Done hardcopy to robin  

## 2012-01-15 NOTE — Telephone Encounter (Signed)
Pharmacy requesting refill on Diazepam 2 mg 1 po BID PRN, please advise. Pharmacy [facility].

## 2012-02-28 ENCOUNTER — Other Ambulatory Visit: Payer: Self-pay

## 2012-02-28 MED ORDER — MORPHINE SULFATE ER 30 MG PO TBCR: 30.0000 mg | EXTENDED_RELEASE_TABLET | Freq: Two times a day (BID) | ORAL | Status: DC

## 2012-02-28 NOTE — Telephone Encounter (Signed)
Done hardcopy to robin  

## 2012-02-28 NOTE — Telephone Encounter (Signed)
Pt called requesting 3 mth refill of pain medication.  

## 2012-02-28 NOTE — Telephone Encounter (Signed)
Called the patient informed hardcopy of requested prescriptions are ready for pickup at the front desk.

## 2012-05-31 ENCOUNTER — Encounter: Payer: Self-pay | Admitting: Internal Medicine

## 2012-05-31 ENCOUNTER — Ambulatory Visit (INDEPENDENT_AMBULATORY_CARE_PROVIDER_SITE_OTHER): Payer: Self-pay | Admitting: Internal Medicine

## 2012-05-31 VITALS — BP 104/68 | HR 66 | Temp 98.6°F | Ht 72.0 in | Wt 188.0 lb

## 2012-05-31 DIAGNOSIS — I10 Essential (primary) hypertension: Secondary | ICD-10-CM

## 2012-05-31 DIAGNOSIS — Z23 Encounter for immunization: Secondary | ICD-10-CM

## 2012-05-31 DIAGNOSIS — M545 Low back pain: Secondary | ICD-10-CM

## 2012-05-31 DIAGNOSIS — G8929 Other chronic pain: Secondary | ICD-10-CM

## 2012-05-31 MED ORDER — NAPROXEN 500 MG PO TABS: 500.0000 mg | ORAL_TABLET | Freq: Two times a day (BID) | ORAL | Status: DC

## 2012-05-31 MED ORDER — MORPHINE SULFATE ER 30 MG PO TBCR: 30.0000 mg | EXTENDED_RELEASE_TABLET | Freq: Two times a day (BID) | ORAL | Status: DC

## 2012-05-31 MED ORDER — BENAZEPRIL HCL 5 MG PO TABS: 5.0000 mg | ORAL_TABLET | Freq: Every day | ORAL | Status: DC
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
## 2012-05-31 NOTE — Patient Instructions (Addendum)
You had the flu shot today Continue all other medications as before You are given the refill today Thank you for enrolling in MyChart. Please follow the instructions below to securely access your online medical record. MyChart allows you to send messages to your doctor, view your test results, renew your prescriptions, schedule appointments, and more. To Log into MyChart, please go to [email or web address], and your Username is:  [email or web address] Please return in 6 months, or sooner if needed

## 2012-06-01 ENCOUNTER — Encounter: Payer: Self-pay | Admitting: Internal Medicine

## 2012-06-01 NOTE — Assessment & Plan Note (Signed)
stable overall by hx and exam, most recent data reviewed with pt, and pt to continue medical treatment as before BP Readings from Last 3 Encounters:  05/31/12 104/68  12/01/11 118/70  05/26/11 110/82

## 2012-06-01 NOTE — Assessment & Plan Note (Signed)
stable overall by hx and exam, and pt to continue medical treatment as before, for med refills today 

## 2012-08-27 ENCOUNTER — Other Ambulatory Visit: Payer: Self-pay | Admitting: Internal Medicine

## 2012-08-27 MED ORDER — MORPHINE SULFATE ER 30 MG PO TBCR: 30.0000 mg | EXTENDED_RELEASE_TABLET | Freq: Two times a day (BID) | ORAL | Status: DC

## 2012-08-27 NOTE — Telephone Encounter (Signed)
Done hardcopy to robin  

## 2012-08-27 NOTE — Telephone Encounter (Signed)
Pt called requesting 3 mths refills of Morphine, please advise

## 2012-08-28 NOTE — Telephone Encounter (Signed)
Called the patient informed to pickup hardcopy's of medication refills at the front desk.

## 2012-11-29 ENCOUNTER — Encounter: Payer: Self-pay | Admitting: Internal Medicine

## 2012-11-29 ENCOUNTER — Ambulatory Visit (INDEPENDENT_AMBULATORY_CARE_PROVIDER_SITE_OTHER): Payer: Self-pay | Admitting: Internal Medicine

## 2012-11-29 ENCOUNTER — Ambulatory Visit (INDEPENDENT_AMBULATORY_CARE_PROVIDER_SITE_OTHER)
Admission: RE | Admit: 2012-11-29 | Discharge: 2012-11-29 | Disposition: A | Discharge status: Home / Self Care | Payer: Self-pay | Source: Ambulatory Visit | Attending: Internal Medicine | Admitting: Internal Medicine

## 2012-11-29 ENCOUNTER — Telehealth: Payer: Self-pay | Admitting: Internal Medicine

## 2012-11-29 ENCOUNTER — Other Ambulatory Visit (INDEPENDENT_AMBULATORY_CARE_PROVIDER_SITE_OTHER): Payer: Self-pay

## 2012-11-29 VITALS — BP 102/70 | HR 68 | Temp 98.2°F | Ht 72.0 in | Wt 178.0 lb

## 2012-11-29 DIAGNOSIS — R634 Abnormal weight loss: Secondary | ICD-10-CM

## 2012-11-29 DIAGNOSIS — R112 Nausea with vomiting, unspecified: Secondary | ICD-10-CM | POA: Insufficient documentation

## 2012-11-29 DIAGNOSIS — A64 Unspecified sexually transmitted disease: Secondary | ICD-10-CM

## 2012-11-29 DIAGNOSIS — R5381 Other malaise: Secondary | ICD-10-CM

## 2012-11-29 DIAGNOSIS — R5383 Other fatigue: Secondary | ICD-10-CM

## 2012-11-29 LAB — URINALYSIS, ROUTINE W REFLEX MICROSCOPIC
Bilirubin Urine: NEGATIVE
Hgb urine dipstick: NEGATIVE
Leukocytes, UA: NEGATIVE
Nitrite: NEGATIVE
Total Protein, Urine: NEGATIVE
pH: 6.5 (ref 5.0–8.0)

## 2012-11-29 LAB — CBC WITH DIFFERENTIAL/PLATELET
Basophils Relative: 0.4 % (ref 0.0–3.0)
Eosinophils Relative: 2.6 % (ref 0.0–5.0)
Lymphocytes Relative: 17.2 % (ref 12.0–46.0)
MCV: 91.1 fl (ref 78.0–100.0)
Monocytes Absolute: 0.8 10*3/uL (ref 0.1–1.0)
Neutrophils Relative %: 73 % (ref 43.0–77.0)
Platelets: 247 10*3/uL (ref 150.0–400.0)
RBC: 4.23 Mil/uL (ref 4.22–5.81)
WBC: 11.6 10*3/uL — ABNORMAL HIGH (ref 4.5–10.5)

## 2012-11-29 LAB — BASIC METABOLIC PANEL
BUN: 18 mg/dL (ref 6–23)
CO2: 29 mEq/L (ref 19–32)
Chloride: 104 mEq/L (ref 96–112)
Creatinine, Ser: 1.1 mg/dL (ref 0.4–1.5)
Glucose, Bld: 74 mg/dL (ref 70–99)
Potassium: 4.2 mEq/L (ref 3.5–5.1)

## 2012-11-29 LAB — HEPATIC FUNCTION PANEL
ALT: 19 U/L (ref 0–53)
AST: 21 U/L (ref 0–37)
Albumin: 3.8 g/dL (ref 3.5–5.2)
Total Protein: 6.9 g/dL (ref 6.0–8.3)

## 2012-11-29 LAB — TSH: TSH: 1.42 u[IU]/mL (ref 0.35–5.50)

## 2012-11-29 LAB — SEDIMENTATION RATE: Sed Rate: 15 mm/hr (ref 0–22)

## 2012-11-29 MED ORDER — AMOXICILLIN-POT CLAVULANATE 875-125 MG PO TABS: 1.0000 | ORAL_TABLET | Freq: Two times a day (BID) | ORAL | Status: DC

## 2012-11-29 MED ORDER — MORPHINE SULFATE ER 30 MG PO TBCR: 30.0000 mg | EXTENDED_RELEASE_TABLET | Freq: Two times a day (BID) | ORAL | Status: DC

## 2012-11-29 MED ORDER — BENAZEPRIL HCL 5 MG PO TABS: 5.0000 mg | ORAL_TABLET | Freq: Every day | ORAL | Status: DC

## 2012-11-29 MED ORDER — DIAZEPAM 2 MG PO TABS: ORAL_TABLET | ORAL | Status: DC

## 2012-11-29 MED ORDER — FAMOTIDINE 20 MG PO TABS: 20.0000 mg | ORAL_TABLET | Freq: Two times a day (BID) | ORAL | Status: AC

## 2012-11-29 NOTE — Telephone Encounter (Signed)
Spoke to pt by phone  cxr with ? RLL pna; could be aspirate pna  OK for augmentin course; pt without insurance but will try to get this  Done erx

## 2012-11-29 NOTE — Assessment & Plan Note (Addendum)
ECG reviewed as per emr, etiolgoy o/w unclear, for cxr, labs as per emr,  to f/u any worsening symptoms or concerns
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
## 2012-11-29 NOTE — Patient Instructions (Addendum)
Please take all new medication as prescribed - the pepcid twice per day Please continue all other medications as before Your EKG was OK today Please go to the XRAY Department in the Basement (go straight as you get off the elevator) for the x-ray testing Please go to the LAB in the Basement (turn left off the elevator) for the tests to be done today You will be contacted regarding the referral for: GI Please stop smoking marijuana, as this can cause a Vomiting problem (cannabinoid hyperemesis syndrome) Please stop the Naprosyn

## 2012-11-30 LAB — RPR

## 2012-12-01 NOTE — Assessment & Plan Note (Signed)
Ok for std testing per pt request

## 2012-12-01 NOTE — Assessment & Plan Note (Signed)
With hx of barretts per pt, PPI intolerant, for pepcid 20 bid, refer GI, may need EGD

## 2012-12-01 NOTE — Assessment & Plan Note (Addendum)
Etiology unclear, likely related to lower calorie intake, to stop marijuana if causing n/v, for cxr as above and labs  Note:  Total time for pt hx, exam, review of record with pt in the room, determination of diagnoses and plan for further eval and tx is > 40 min, with over 50% spent in coordination and counseling of patient

## 2012-12-02 ENCOUNTER — Other Ambulatory Visit: Payer: Self-pay | Admitting: Internal Medicine

## 2012-12-02 DIAGNOSIS — R768 Other specified abnormal immunological findings in serum: Secondary | ICD-10-CM

## 2012-12-02 LAB — HEPATITIS PANEL, ACUTE
HCV Ab: REACTIVE — AB
Hep B C IgM: NEGATIVE
Hepatitis B Surface Ag: NEGATIVE

## 2012-12-02 LAB — HSV 2 ANTIBODY, IGG: HSV 2 Glycoprotein G Ab, IgG: 0.11 IV

## 2013-02-14 ENCOUNTER — Encounter: Payer: Self-pay | Admitting: Internal Medicine

## 2013-02-19 ENCOUNTER — Telehealth: Payer: Self-pay | Admitting: *Deleted

## 2013-02-19 MED ORDER — MORPHINE SULFATE ER 30 MG PO TBCR: 30.0000 mg | EXTENDED_RELEASE_TABLET | Freq: Two times a day (BID) | ORAL | Status: DC

## 2013-02-19 MED ORDER — DIAZEPAM 2 MG PO TABS: ORAL_TABLET | ORAL | Status: AC

## 2013-02-19 NOTE — Telephone Encounter (Signed)
Pt called requesting Diazepam and Morphine refills.  Please advise

## 2013-02-19 NOTE — Telephone Encounter (Signed)
Called the patient to inform but mailbox not set up could not leave a msg

## 2013-02-19 NOTE — Telephone Encounter (Signed)
Patient called back and was informed.

## 2013-02-19 NOTE — Telephone Encounter (Signed)
Done hardcopy to robin  

## 2013-05-01 ENCOUNTER — Other Ambulatory Visit: Payer: Self-pay

## 2013-05-21 ENCOUNTER — Other Ambulatory Visit (INDEPENDENT_AMBULATORY_CARE_PROVIDER_SITE_OTHER): Payer: Self-pay

## 2013-05-21 ENCOUNTER — Ambulatory Visit (INDEPENDENT_AMBULATORY_CARE_PROVIDER_SITE_OTHER)
Admission: RE | Admit: 2013-05-21 | Discharge: 2013-05-21 | Disposition: A | Discharge status: Home / Self Care | Payer: Self-pay | Source: Ambulatory Visit | Attending: Internal Medicine | Admitting: Internal Medicine

## 2013-05-21 ENCOUNTER — Ambulatory Visit (INDEPENDENT_AMBULATORY_CARE_PROVIDER_SITE_OTHER): Payer: Self-pay | Admitting: Internal Medicine

## 2013-05-21 ENCOUNTER — Telehealth: Payer: Self-pay | Admitting: Internal Medicine

## 2013-05-21 ENCOUNTER — Encounter: Payer: Self-pay | Admitting: Internal Medicine

## 2013-05-21 VITALS — BP 138/80 | HR 64 | Temp 97.8°F | Ht 72.0 in | Wt 185.2 lb

## 2013-05-21 DIAGNOSIS — R5381 Other malaise: Secondary | ICD-10-CM

## 2013-05-21 DIAGNOSIS — R112 Nausea with vomiting, unspecified: Secondary | ICD-10-CM

## 2013-05-21 DIAGNOSIS — R768 Other specified abnormal immunological findings in serum: Secondary | ICD-10-CM

## 2013-05-21 DIAGNOSIS — G8929 Other chronic pain: Secondary | ICD-10-CM

## 2013-05-21 DIAGNOSIS — R05 Cough: Secondary | ICD-10-CM

## 2013-05-21 DIAGNOSIS — Z23 Encounter for immunization: Secondary | ICD-10-CM

## 2013-05-21 DIAGNOSIS — R059 Cough, unspecified: Secondary | ICD-10-CM

## 2013-05-21 DIAGNOSIS — M545 Low back pain: Secondary | ICD-10-CM

## 2013-05-21 DIAGNOSIS — R9389 Abnormal findings on diagnostic imaging of other specified body structures: Secondary | ICD-10-CM

## 2013-05-21 DIAGNOSIS — R5383 Other fatigue: Secondary | ICD-10-CM

## 2013-05-21 DIAGNOSIS — R894 Abnormal immunological findings in specimens from other organs, systems and tissues: Secondary | ICD-10-CM

## 2013-05-21 LAB — CBC WITH DIFFERENTIAL/PLATELET
Basophils Absolute: 0 10*3/uL (ref 0.0–0.1)
Eosinophils Absolute: 0.1 10*3/uL (ref 0.0–0.7)
Eosinophils Relative: 1.2 % (ref 0.0–5.0)
HCT: 39.6 % (ref 39.0–52.0)
Hemoglobin: 13.5 g/dL (ref 13.0–17.0)
Lymphs Abs: 1.6 10*3/uL (ref 0.7–4.0)
MCHC: 34 g/dL (ref 30.0–36.0)
Monocytes Relative: 8.6 % (ref 3.0–12.0)
Neutro Abs: 7.8 10*3/uL — ABNORMAL HIGH (ref 1.4–7.7)
Platelets: 271 10*3/uL (ref 150.0–400.0)
RDW: 13.3 % (ref 11.5–14.6)

## 2013-05-21 LAB — TESTOSTERONE: Testosterone: 362.12 ng/dL (ref 350.00–890.00)

## 2013-05-21 LAB — URINALYSIS, ROUTINE W REFLEX MICROSCOPIC
Bilirubin Urine: NEGATIVE
Hgb urine dipstick: NEGATIVE
Ketones, ur: NEGATIVE
Leukocytes, UA: NEGATIVE
Urine Glucose: NEGATIVE
Urobilinogen, UA: 0.2 (ref 0.0–1.0)
pH: 7 (ref 5.0–8.0)

## 2013-05-21 LAB — BASIC METABOLIC PANEL
CO2: 31 mEq/L (ref 19–32)
Calcium: 9.3 mg/dL (ref 8.4–10.5)
Creatinine, Ser: 0.9 mg/dL (ref 0.4–1.5)
GFR: 89.95 mL/min (ref >60.00)
Glucose, Bld: 95 mg/dL (ref 70–99)
Potassium: 4.5 mEq/L (ref 3.5–5.1)
Sodium: 138 mEq/L (ref 135–145)

## 2013-05-21 LAB — HEPATIC FUNCTION PANEL
AST: 23 U/L (ref 0–37)
Albumin: 4.1 g/dL (ref 3.5–5.2)
Alkaline Phosphatase: 62 U/L (ref 39–117)
Bilirubin, Direct: 0 mg/dL (ref 0.0–0.3)
Total Protein: 7.3 g/dL (ref 6.0–8.3)

## 2013-05-21 LAB — SEDIMENTATION RATE: Sed Rate: 17 mm/hr (ref 0–22)

## 2013-05-21 LAB — LIPASE: Lipase: 25 U/L (ref 11.0–59.0)

## 2013-05-21 LAB — TSH: TSH: 2.42 u[IU]/mL (ref 0.35–5.50)

## 2013-05-21 MED ORDER — MORPHINE SULFATE ER 30 MG PO TBCR: 30.0000 mg | EXTENDED_RELEASE_TABLET | Freq: Two times a day (BID) | ORAL | Status: AC

## 2013-05-21 MED ORDER — MORPHINE SULFATE ER 30 MG PO TBCR: 30.0000 mg | EXTENDED_RELEASE_TABLET | Freq: Two times a day (BID) | ORAL | Status: DC

## 2013-05-21 MED ORDER — BENAZEPRIL HCL 5 MG PO TABS: 5.0000 mg | ORAL_TABLET | Freq: Every day | ORAL | Status: AC

## 2013-05-21 NOTE — Assessment & Plan Note (Signed)
Etiology unclear, ? Psychogenic component but pt denies, exam o/w benign , for labs eval today including tsh and testosterone, cbc,  to f/u any worsening symptoms or concerns  Note:  Total time for pt hx, exam, review of record with pt in the room, determination of diagnoses and plan for further eval and tx is > 40 min, with over 50% spent in coordination and counseling of patient

## 2013-05-21 NOTE — Assessment & Plan Note (Signed)
D/w pt , needs hep C RNA quant to determine if active or not

## 2013-05-21 NOTE — Progress Notes (Signed)
Pre-visit discussion using our clinic review tool. No additional management support is needed unless otherwise documented below in the visit note.  

## 2013-05-21 NOTE — Assessment & Plan Note (Signed)
Note to close chart

## 2013-05-21 NOTE — Assessment & Plan Note (Signed)
Overall stable, for pain med refill,  to f/u any worsening symptoms or concerns

## 2013-05-21 NOTE — Assessment & Plan Note (Signed)
With recurrent symtpoms, no fever, exam benign today, for cxr f/u, consider repeat augmentin and pulm referral

## 2013-05-21 NOTE — Telephone Encounter (Signed)
Done in error.

## 2013-05-21 NOTE — Patient Instructions (Addendum)
Please continue all other medications as before Please have the pharmacy call with any other refills you may need. Please go to the XRAY Department in the Basement (go straight as you get off the elevator) for the x-ray testing Please go to the LAB in the Basement (turn left off the elevator) for the tests to be done today You will be contacted by phone if any changes need to be made immediately.  Otherwise, you will receive a letter about your results with an explanation, but please check with MyChart first.  You will be contacted regarding the referral for: GI (and possibly pulm if abnormal cxr)  Please remember to sign up for My Chart if you have not done so, as this will be important to you in the future with finding out test results, communicating by private email, and scheduling acute appointments online when needed.  Please return in 3 months, or sooner if needed

## 2013-05-21 NOTE — Assessment & Plan Note (Signed)
Persistent recurrent dyspeptic like it seems, to refer GI
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
## 2013-05-23 ENCOUNTER — Ambulatory Visit (INDEPENDENT_AMBULATORY_CARE_PROVIDER_SITE_OTHER)
Admission: RE | Admit: 2013-05-23 | Discharge: 2013-05-23 | Disposition: A | Discharge status: Home / Self Care | Payer: Self-pay | Source: Ambulatory Visit | Attending: Internal Medicine | Admitting: Internal Medicine

## 2013-05-23 DIAGNOSIS — R9389 Abnormal findings on diagnostic imaging of other specified body structures: Secondary | ICD-10-CM

## 2013-05-23 DIAGNOSIS — R918 Other nonspecific abnormal finding of lung field: Secondary | ICD-10-CM

## 2013-05-23 LAB — ANA: Anti Nuclear Antibody(ANA): NEGATIVE

## 2013-05-23 MED ORDER — IOHEXOL 300 MG/ML  SOLN
80.0000 mL | Freq: Once | INTRAMUSCULAR | Status: AC | PRN
  Administered 2013-05-23: 80 mL via INTRAVENOUS

## 2013-05-24 ENCOUNTER — Telehealth: Payer: Self-pay | Admitting: Internal Medicine

## 2013-05-24 MED ORDER — AMOXICILLIN-POT CLAVULANATE 875-125 MG PO TABS: 1.0000 | ORAL_TABLET | Freq: Two times a day (BID) | ORAL | Status: AC

## 2013-05-24 NOTE — Telephone Encounter (Signed)
Robin to also call pt - pna on CT - for antibx, will need f/u cxr in about 10 days

## 2013-05-27 NOTE — Telephone Encounter (Signed)
I think this was the second antibx that was more affordable last visit  I would at least get the cxr in 10 days, and I can refer to pulmonary if he likes.  His depression will be better after the PNA is treated, as this is a sitatuional worsening.

## 2013-05-27 NOTE — Telephone Encounter (Signed)
Called the patient informed of results.  The patient is concerned antibiotic will be too expensive, but aware alternative may be too expensive as well.  The patient was very discouraged today with hearing he had pneumonia.  States he felt something else wrong as feeling worse everyday.  Concerned about his liver as well.  Patient did appreciate PCP's care for his health.  Discouraged as cannot afford the government insurance that's available, would cost at least $300 per month.  Patient also stated he continues to be depressed.  Advise please.

## 2013-05-27 NOTE — Telephone Encounter (Signed)
Called the patient no answer and no voice mail set up to leave message.

## 2013-05-27 NOTE — Telephone Encounter (Signed)
Called the patient left message with his Dad to call back.  Called his mobile number unable to leave a message.

## 2013-05-28 ENCOUNTER — Other Ambulatory Visit: Payer: Self-pay

## 2013-05-28 NOTE — Telephone Encounter (Signed)
Called the patient no answer and no voice mail 

## 2013-05-28 NOTE — Telephone Encounter (Signed)
The patient does not want a referral to pulomary at this time.  States hopefully by the time he sees PCP next OV he will have insurance and can get the care needed to evaluate further these problems.

## 2013-05-28 NOTE — Telephone Encounter (Signed)
Called the patient informed of MD instructions. 

## 2013-05-30 ENCOUNTER — Ambulatory Visit: Payer: Self-pay | Admitting: Internal Medicine

## 2013-08-22 ENCOUNTER — Ambulatory Visit: Payer: Self-pay | Admitting: Internal Medicine

## 2013-08-22 DIAGNOSIS — Z0289 Encounter for other administrative examinations: Secondary | ICD-10-CM

## 2013-08-25 ENCOUNTER — Telehealth: Payer: Self-pay | Admitting: Internal Medicine

## 2013-08-25 NOTE — Telephone Encounter (Signed)
[facility] an [facility] reqeust  Pt may need to be called to clarify his need

## 2013-08-25 NOTE — Telephone Encounter (Signed)
Pt request referral to [facility] [phone], is the insurance. Pt stated that he is located and need help getting in to this place. Pt request phone call from Robin.

## 2013-09-05 ENCOUNTER — Telehealth: Payer: Self-pay | Admitting: *Deleted

## 2013-09-05 NOTE — Telephone Encounter (Signed)
Very sorry, but we do not normally need to refer to a different PCP, and as i am not familiar with [facility], I would suggest calling his insur company or referring to any written information he has regarding available new PCP''s; we can forward records after pt request to Medical Records  I am not sure about the pain management referral either, I suspect this should be done through his new PCP to make sure it is "in network"

## 2013-09-05 NOTE — Telephone Encounter (Signed)
Patient phoned requesting a referral for a PCP thru [facility] and  [facility]?)   Please advise.  CB# [phone]

## 2013-09-05 NOTE — Telephone Encounter (Signed)
Called the patient informed of MD instructions.  The  Patient stated he has been denied already trying on his own to get a new PCP.  But stated would continue to do so.

## 2014-07-09 IMAGING — CT CT CHEST W/ CM
2 of 4 series · 15 of 36 positions shown, 18 images · IV contrast (Omnipaque 300)
Comparison: Chest x-ray 05/21/2013.  No prior chest CTs.

CLINICAL DATA: Right lung density seen on chest x-ray.

EXAM:
CT CHEST WITH CONTRAST
TECHNIQUE: Multidetector CT imaging of the chest was performed during
intravenous contrast administration.
CONTRAST:  80mL OMNIPAQUE IOHEXOL 300 MG/ML  SOLN

[Series 2: chest routine with · axial · 0.81mm/px · z∈[-335,-30]mm · 12 of 73 slices shown, 15 images]
[im 6/73  mediastinal]
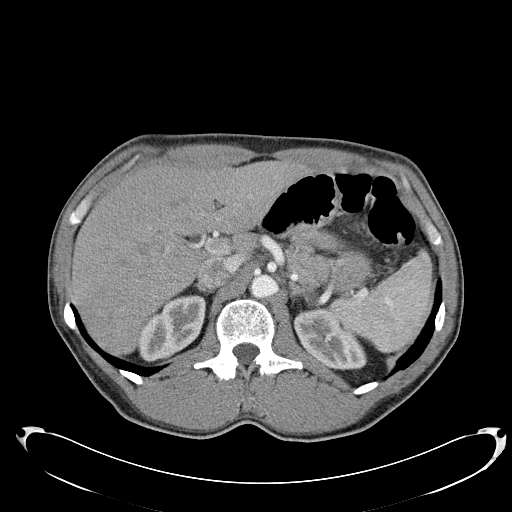
[im 6/73  lung]
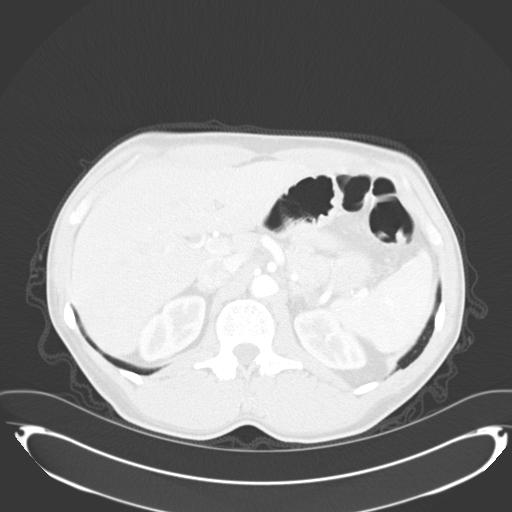
[im 12/73  lung]
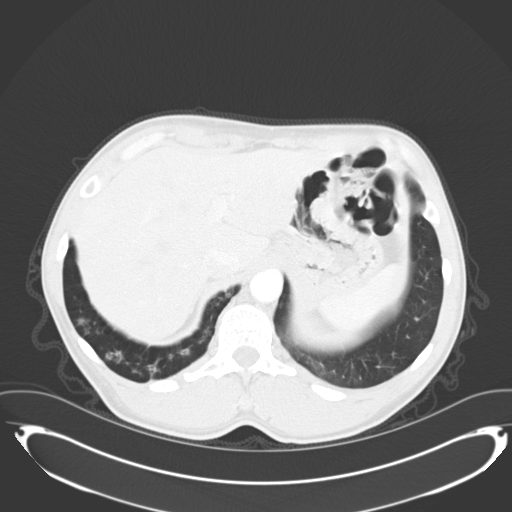
[im 17/73  lung]
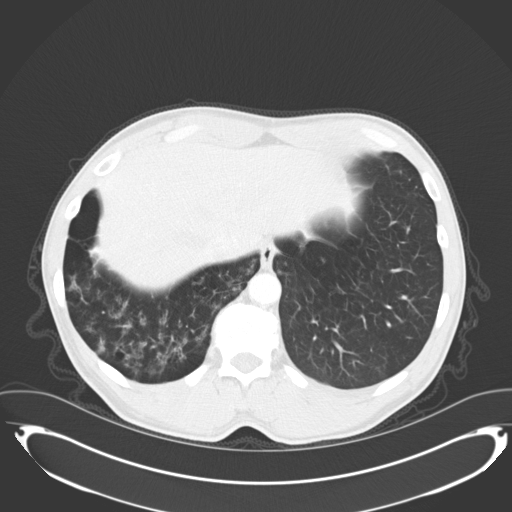
[im 23/73  lung]
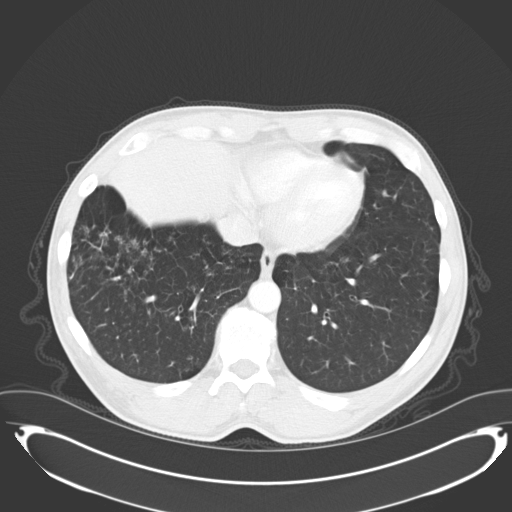
[im 28/73  mediastinal]
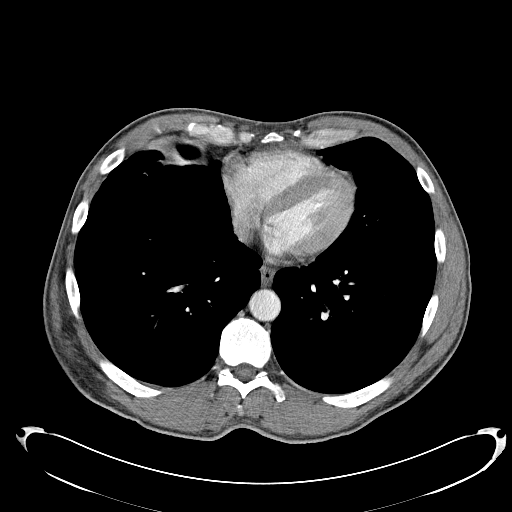
[im 28/73  lung]
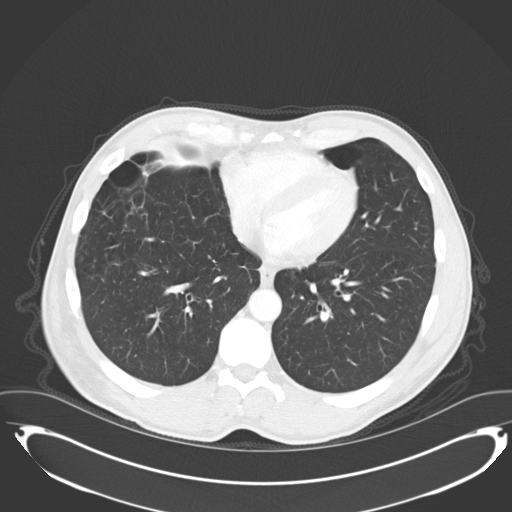
[im 34/73  lung]
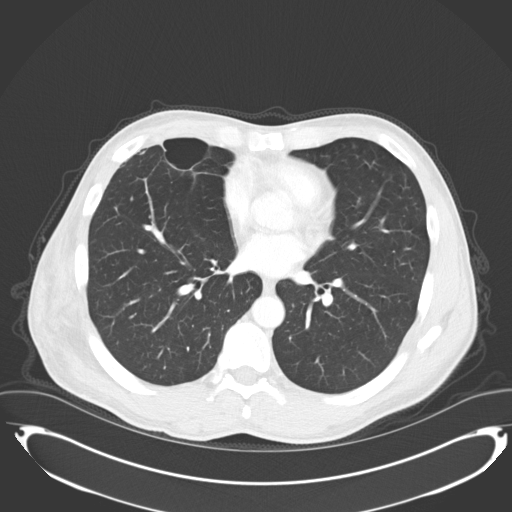
[im 39/73  lung]
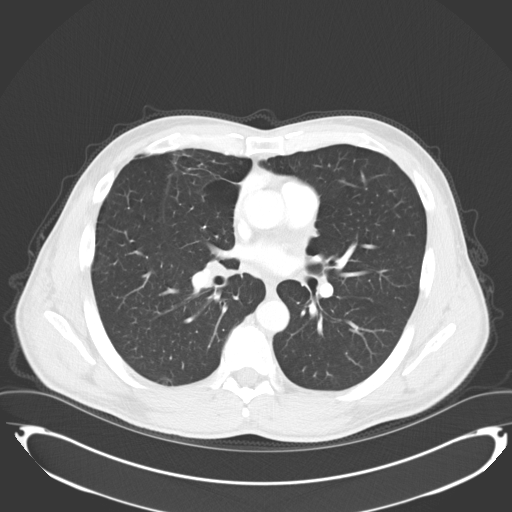
[im 45/73  lung]
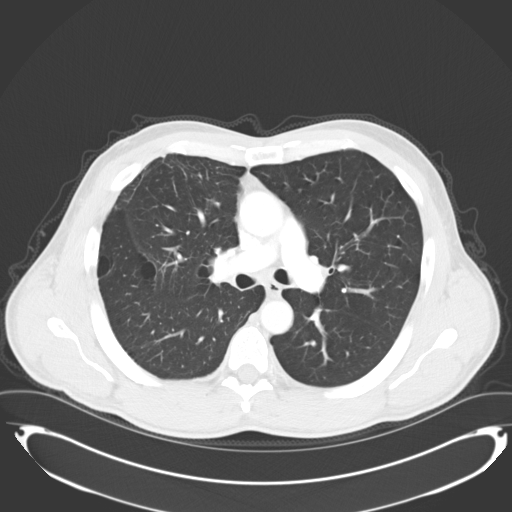
[im 50/73  mediastinal]
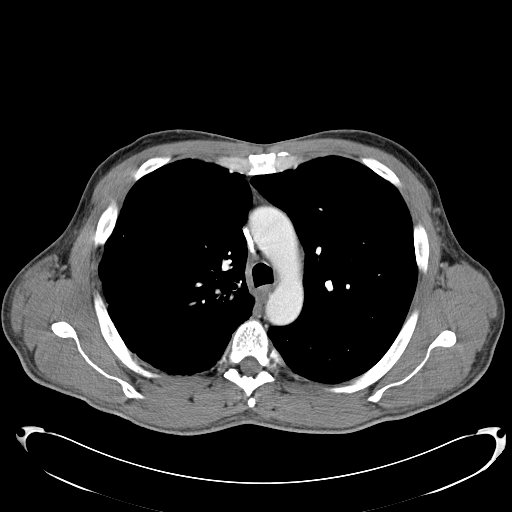
[im 50/73  lung]
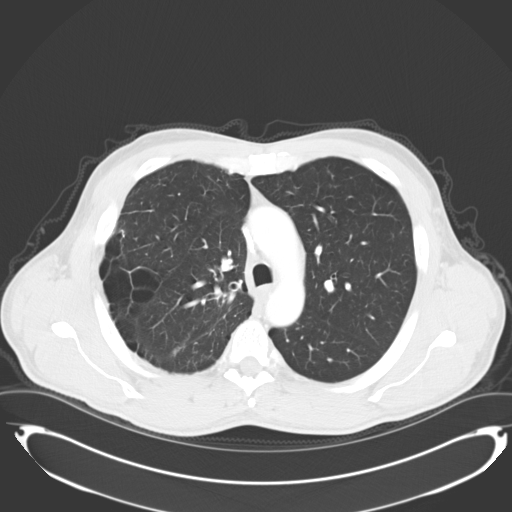
[im 56/73  lung]
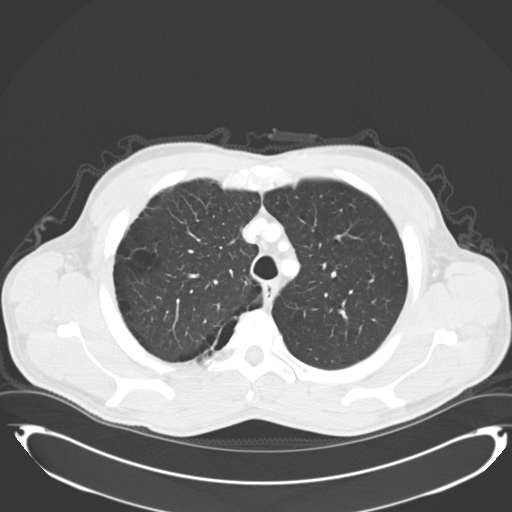
[im 61/73  lung]
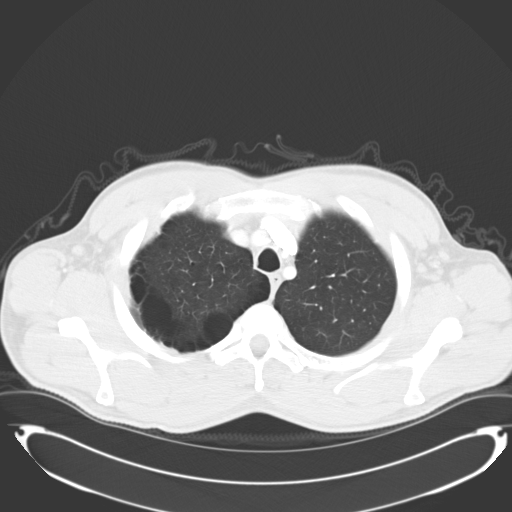
[im 67/73  lung]
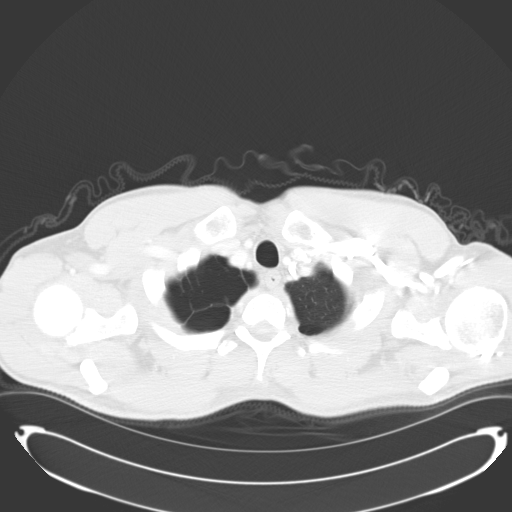

[Series 602: cor · coronal · 0.81mm/px · 3 of 117 slices shown]
[im 24/117  lung]
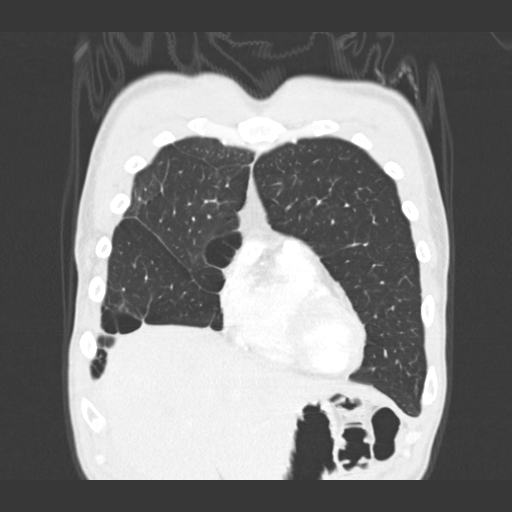
[im 47/117  lung]
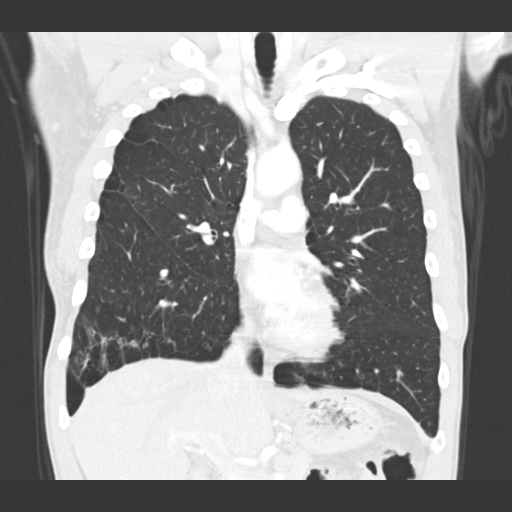
[im 70/117  lung]
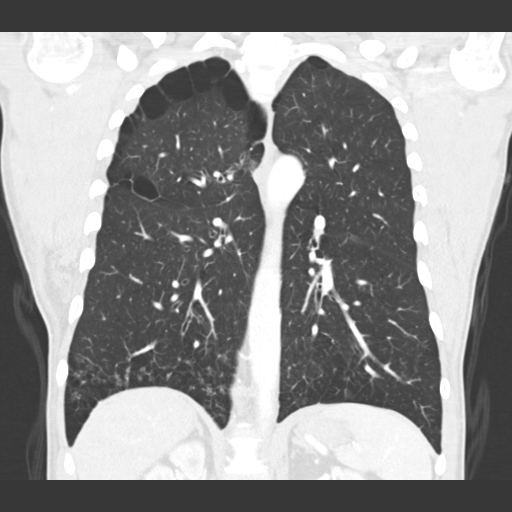

[15 of 36 positions shown; findings below may reference images not displayed]

FINDINGS: Mediastinum: Heart size is normal. There is no significant
pericardial fluid, thickening or pericardial calcification. No
pathologically enlarged mediastinal or hilar lymph nodes. Borderline
enlarged right hilar lymph nodes, presumably reactive. Esophagus is
unremarkable in appearance.

Lungs/Pleura: Postoperative changes of wedge resection in the right
upper lobe posteriorly. Background of mild centrilobular and
moderate paraseptal emphysema, most pronounced throughout the right
lung apex. Extensive bronchial wall thickening with thickening of
the peribronchovascular interstitium and associated
peribronchovascular ground-glass attenuation nodularity throughout
the basal segments of the right lower lobe, presumably mild
bronchopneumonia. No other acute consolidative airspace disease. No
larger solid suspicious appearing pulmonary nodules or masses are
otherwise noted. No pleural effusions.

Upper Abdomen: Unremarkable.

Musculoskeletal: There are no aggressive appearing lytic or blastic
lesions noted in the visualized portions of the skeleton.
IMPRESSION: 1. The appearance of the right lower lobe suggests a
bronchopneumonia.
2. Mild centrilobular and moderate paraseptal emphysema.
